# Patient Record
Sex: Male | Born: 1985 | Race: Black or African American | Hispanic: No | Marital: Single
Health system: Southern US, Community
[De-identification: ages and names within clinical notes are randomized; demographics above are authoritative.]

---

## 2012-01-07 ENCOUNTER — Emergency Department: Payer: Self-pay | Admitting: Emergency Medicine

## 2013-07-22 ENCOUNTER — Other Ambulatory Visit: Payer: Self-pay | Admitting: Family Medicine

## 2013-07-22 ENCOUNTER — Ambulatory Visit
Admission: RE | Admit: 2013-07-22 | Discharge: 2013-07-22 | Disposition: A | Discharge status: Home / Self Care | Payer: Managed Care, Other (non HMO) | Source: Ambulatory Visit | Attending: Family Medicine | Admitting: Family Medicine

## 2013-07-22 DIAGNOSIS — M545 Low back pain, unspecified: Secondary | ICD-10-CM

## 2015-01-08 IMAGING — CR DG LUMBAR SPINE 2-3V
3 series · 3 of 3 positions shown · non-contrast
Comparison: None.

CLINICAL DATA: LEFT LBP

EXAM:
LUMBAR SPINE - 2-3 VIEW

[view not recorded (1 of 3)]
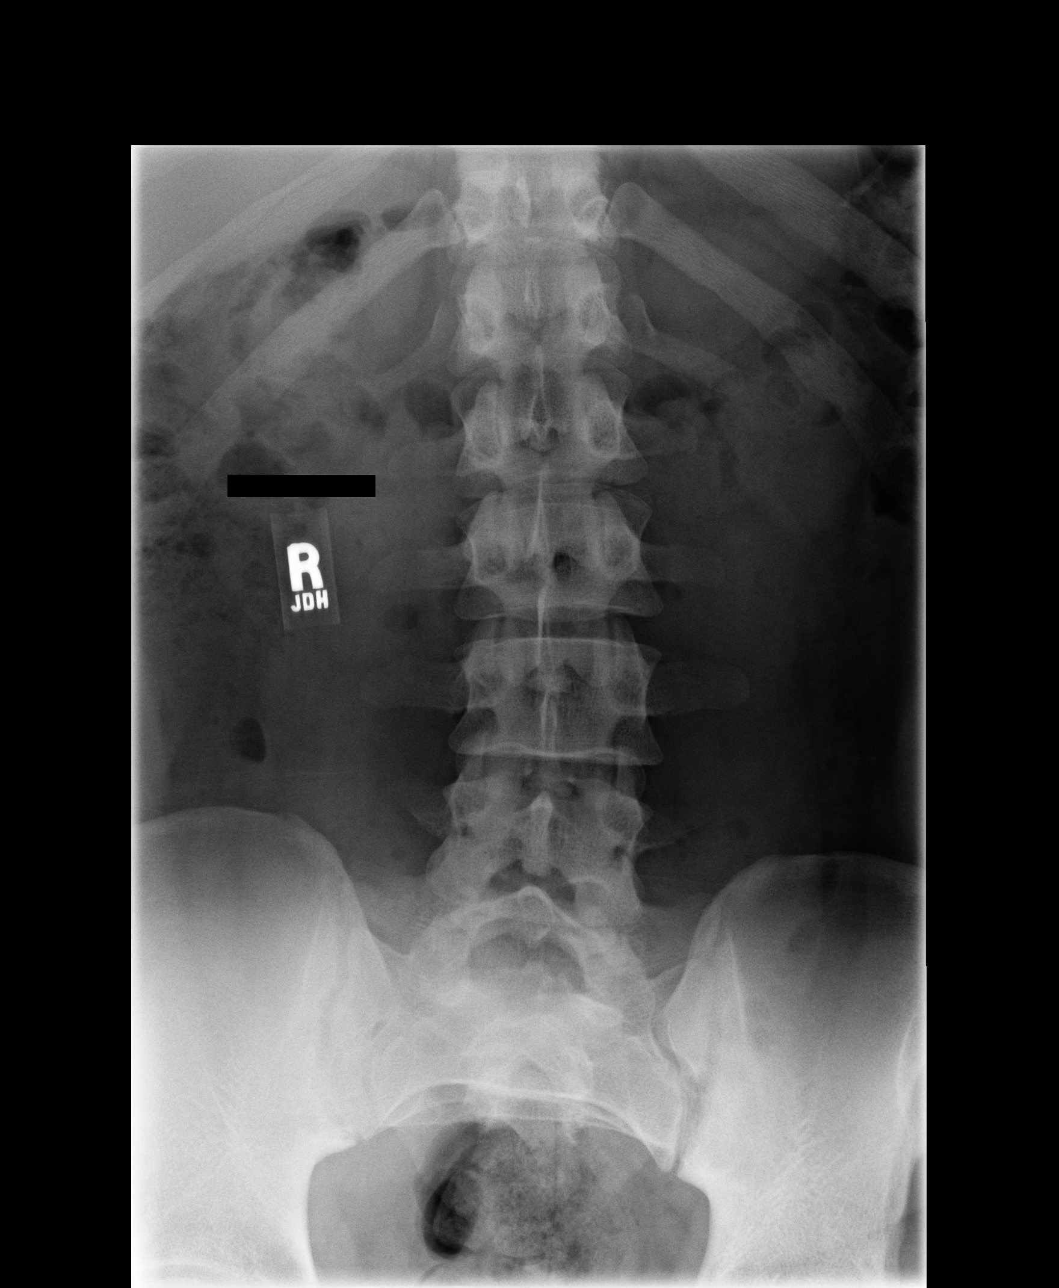

[view not recorded (2 of 3)]
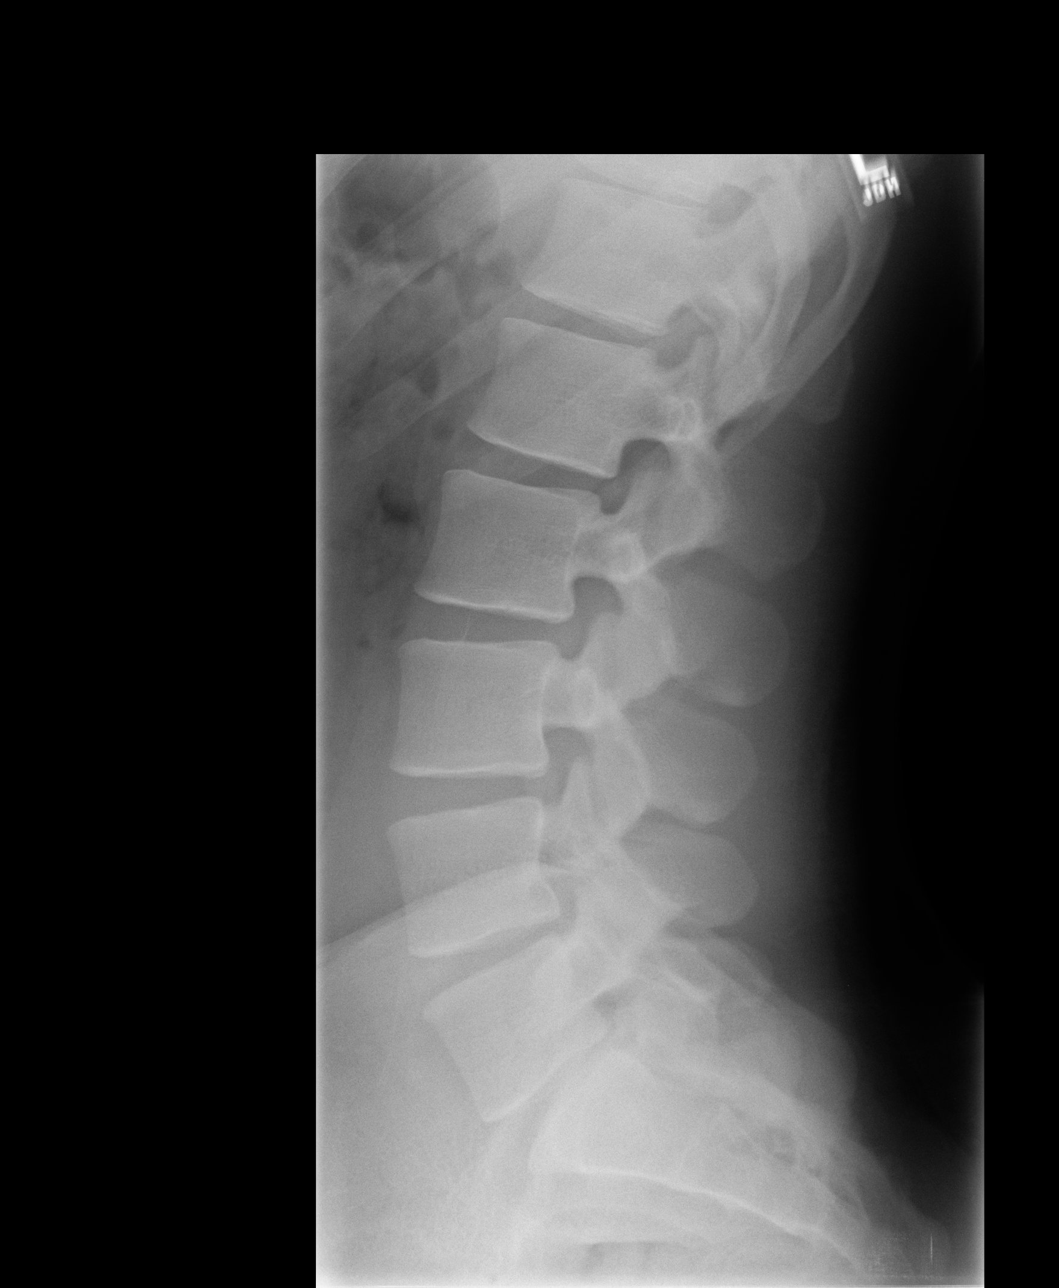

[view not recorded (3 of 3)]
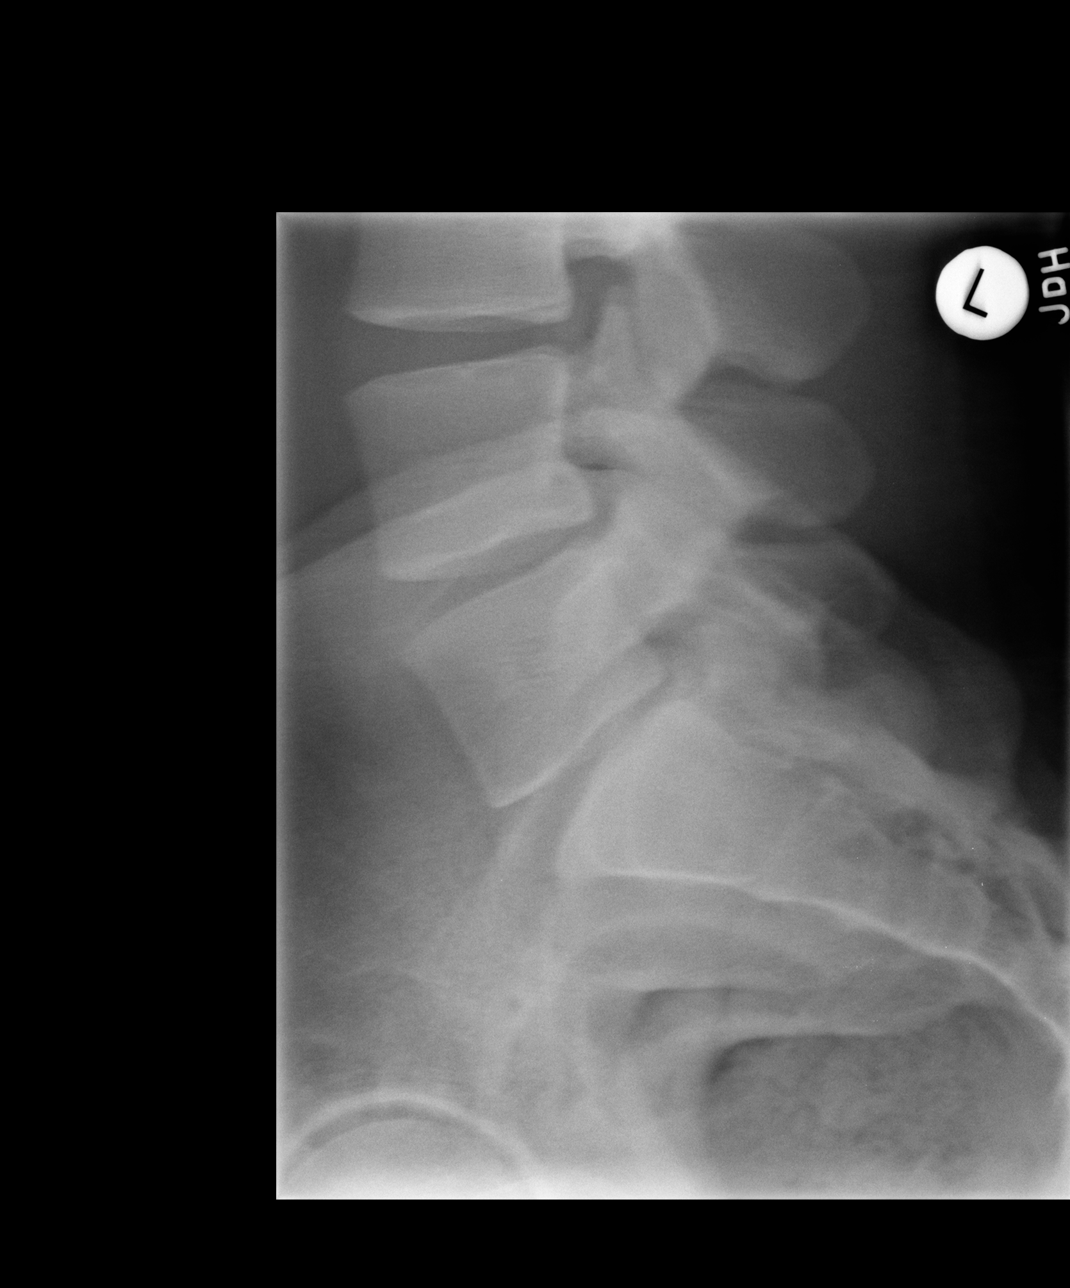

[3 of 3 positions shown; findings below may reference images not displayed]

FINDINGS: Five non rib-bearing lumbar type vertebral bodies are present. Mild
levoscoliosis present. Vertebral bodies are otherwise normally
aligned with preservation of the normal lumbar lordosis. Vertebral
body heights are preserved. No acute fracture or listhesis.

No significant degenerative changes identified.

Paraspinous soft tissues are within normal limits.
IMPRESSION: 1. No acute abnormality identified within the lumbar spine.
2. Mild levoscoliosis.

## 2021-07-24 ENCOUNTER — Emergency Department
Admission: EM | Admit: 2021-07-24 | Discharge: 2021-07-24 | Disposition: A | Discharge status: Home / Self Care | Payer: No Typology Code available for payment source | Attending: Emergency Medicine | Admitting: Emergency Medicine

## 2021-07-24 ENCOUNTER — Other Ambulatory Visit: Payer: Self-pay

## 2021-07-24 DIAGNOSIS — X501XXA Overexertion from prolonged static or awkward postures, initial encounter: Secondary | ICD-10-CM | POA: Insufficient documentation

## 2021-07-24 DIAGNOSIS — S3992XA Unspecified injury of lower back, initial encounter: Secondary | ICD-10-CM | POA: Diagnosis present

## 2021-07-24 DIAGNOSIS — S39012A Strain of muscle, fascia and tendon of lower back, initial encounter: Secondary | ICD-10-CM | POA: Diagnosis not present

## 2021-07-24 MED ORDER — CYCLOBENZAPRINE HCL 10 MG PO TABS
10.0000 mg | ORAL_TABLET | Freq: Once | ORAL | Status: AC
  Administered 2021-07-24: 10 mg via ORAL
  Filled 2021-07-24: qty 1

## 2021-07-24 MED ORDER — IBUPROFEN 600 MG PO TABS
600.0000 mg | ORAL_TABLET | Freq: Once | ORAL | Status: AC
  Administered 2021-07-24: 600 mg via ORAL
  Filled 2021-07-24: qty 1

## 2021-07-24 MED ORDER — CYCLOBENZAPRINE HCL 10 MG PO TABS: 10.0000 mg | ORAL_TABLET | Freq: Three times a day (TID) | ORAL | 0 refills | Status: AC | PRN
